# Patient Record
Sex: Male | Born: 1985 | Race: Asian | Hispanic: Yes | Marital: Married | Smoking: Current every day smoker
Health system: Southern US, Community
[De-identification: ages and names within clinical notes are randomized; demographics above are authoritative.]

---

## 2017-01-19 ENCOUNTER — Ambulatory Visit (HOSPITAL_COMMUNITY)
Admission: EM | Admit: 2017-01-19 | Discharge: 2017-01-19 | Disposition: A | Discharge status: Home / Self Care | Payer: Self-pay | Attending: Family Medicine | Admitting: Family Medicine

## 2017-01-19 ENCOUNTER — Ambulatory Visit (INDEPENDENT_AMBULATORY_CARE_PROVIDER_SITE_OTHER): Payer: Self-pay

## 2017-01-19 ENCOUNTER — Encounter (HOSPITAL_COMMUNITY): Payer: Self-pay | Admitting: *Deleted

## 2017-01-19 DIAGNOSIS — L03116 Cellulitis of left lower limb: Secondary | ICD-10-CM

## 2017-01-19 MED ORDER — TRAMADOL HCL 50 MG PO TABS: 50.0000 mg | ORAL_TABLET | Freq: Four times a day (QID) | ORAL | 0 refills | Status: AC | PRN

## 2017-01-19 MED ORDER — AMOXICILLIN-POT CLAVULANATE 875-125 MG PO TABS: 1.0000 | ORAL_TABLET | Freq: Two times a day (BID) | ORAL | 0 refills | Status: AC

## 2017-01-19 NOTE — ED Provider Notes (Signed)
[facility]    CSN: [id number] Arrival date & time: 01/19/17  1119     History   Chief Complaint Chief Complaint  Patient presents with  . Leg Swelling    HPI Aaron Lester is a 31 y.o. male.   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 He thinks he may have a foreign body injury but is not very communicative.      History reviewed. No pertinent past medical history.  There are no active problems to display for Aaron patient.   History reviewed. No pertinent surgical history.     Home Medications    Prior to Admission medications   Medication Sig Start Date End Date Taking? Authorizing Provider  amoxicillin-clavulanate (AUGMENTIN) 875-125 MG tablet Take 1 tablet by mouth every 12 (twelve) hours. 01/19/17   Elvina Sidle, MD  traMADol (ULTRAM) 50 MG tablet Take 1 tablet (50 mg total) by mouth every 6 (six) hours as needed. 01/19/17   Elvina Sidle, MD    Family History No family history on file.  Social History Social History  Substance Use Topics  . Smoking status: Current Every Day Smoker  . Smokeless tobacco: Not on file  . Alcohol use No     Allergies   Patient has no known allergies.   Review of Systems Review of Systems  Musculoskeletal: Positive for gait problem and joint swelling.  Skin: Positive for rash and wound.  All other systems reviewed and are negative.    Physical Exam Triage Vital Signs ED Triage Vitals [01/19/17 1202]  Enc Vitals Group     BP 108/72     Pulse Rate 100     Resp 18     Temp 98.2 F (36.8 C)     Temp Source Oral     SpO2 100 %     Weight      Height      Head Circumference      Peak Flow      Pain Score 10     Pain Loc    Pain Edu?      Excl. in GC?    No data found.   Updated Vital Signs BP 108/72 (BP Location: Right Arm)   Pulse 100   Temp 98.2 F (36.8 C) (Oral)   Resp 18   SpO2 100%    Physical Exam  Constitutional: He is oriented to person, place, and time. He appears well-developed and well-nourished. He appears distressed.  HENT:  Right Ear: External ear normal.  Left Ear: External ear normal.  Eyes: Conjunctivae and EOM are normal. Pupils are equal, round, and reactive to light.  Neck: Normal range of motion. Neck supple.  Pulmonary/Chest: Effort normal.  Musculoskeletal:  Patient has erythema in the left knee popliteal region with a dry scab medially. There is some swelling and erythema extending in all directions from that eschar about 3-4 cm.  Neurological: He is alert and oriented to person, place, and time.  Skin: Skin is warm and  dry. There is erythema.  Nursing note and vitals reviewed.    UC Treatments / Results  Labs (all labs ordered are listed, but only abnormal results are displayed) Labs Reviewed - No data to display  EKG  EKG Interpretation None       Radiology Dg Knee Complete 4 Views Left  Result Date: 01/19/2017 CLINICAL DATA:  Evaluate for foreign body in left knee. Pain and swelling. EXAM: LEFT KNEE - COMPLETE 4+ VIEW COMPARISON:  None. FINDINGS: No evidence of fracture, dislocation, or joint effusion. Sharpening the tibial spines and medial compartment narrowing identified compatible with degenerative joint disease. Soft tissues are unremarkable. IMPRESSION: 1. No radiopaque foreign bodies identified.  The 2. Degenerative joint disease. Electronically Signed   By: Signa Kellaylor  Stroud M.D.   On: 01/19/2017 12:50    Procedures Procedures (including critical care time)  Medications Ordered in UC Medications - No data to display   Initial Impression / Assessment and Plan / UC Course  I have reviewed the triage vital signs and the nursing notes.  Pertinent  labs & imaging results that were available during my care of the patient were reviewed by me and considered in my medical decision making (see chart for details).     Final Clinical Impressions(s) / UC Diagnoses   Final diagnoses:  Cellulitis of left lower extremity    New Prescriptions New Prescriptions   AMOXICILLIN-CLAVULANATE (AUGMENTIN) 875-125 MG TABLET    Take 1 tablet by mouth every 12 (twelve) hours.   TRAMADOL (ULTRAM) 50 MG TABLET    Take 1 tablet (50 mg total) by mouth every 6 (six) hours as needed.     Elvina SidleLauenstein, Ryszard Socarras, MD 01/19/17 1306

## 2017-01-19 NOTE — ED Triage Notes (Signed)
Pt   Reports     He   Has   Swelling     Behind    l  Knee    denys    Any   specefic  Injury          Puncture   Wound     Present

## 2018-09-22 IMAGING — DX DG KNEE COMPLETE 4+V*L*
4 series · 4 of 4 positions shown · non-contrast
Comparison: None.

CLINICAL DATA: Evaluate for foreign body in left knee. Pain and
swelling.

EXAM:
LEFT KNEE - COMPLETE 4+ VIEW

[knee ap]
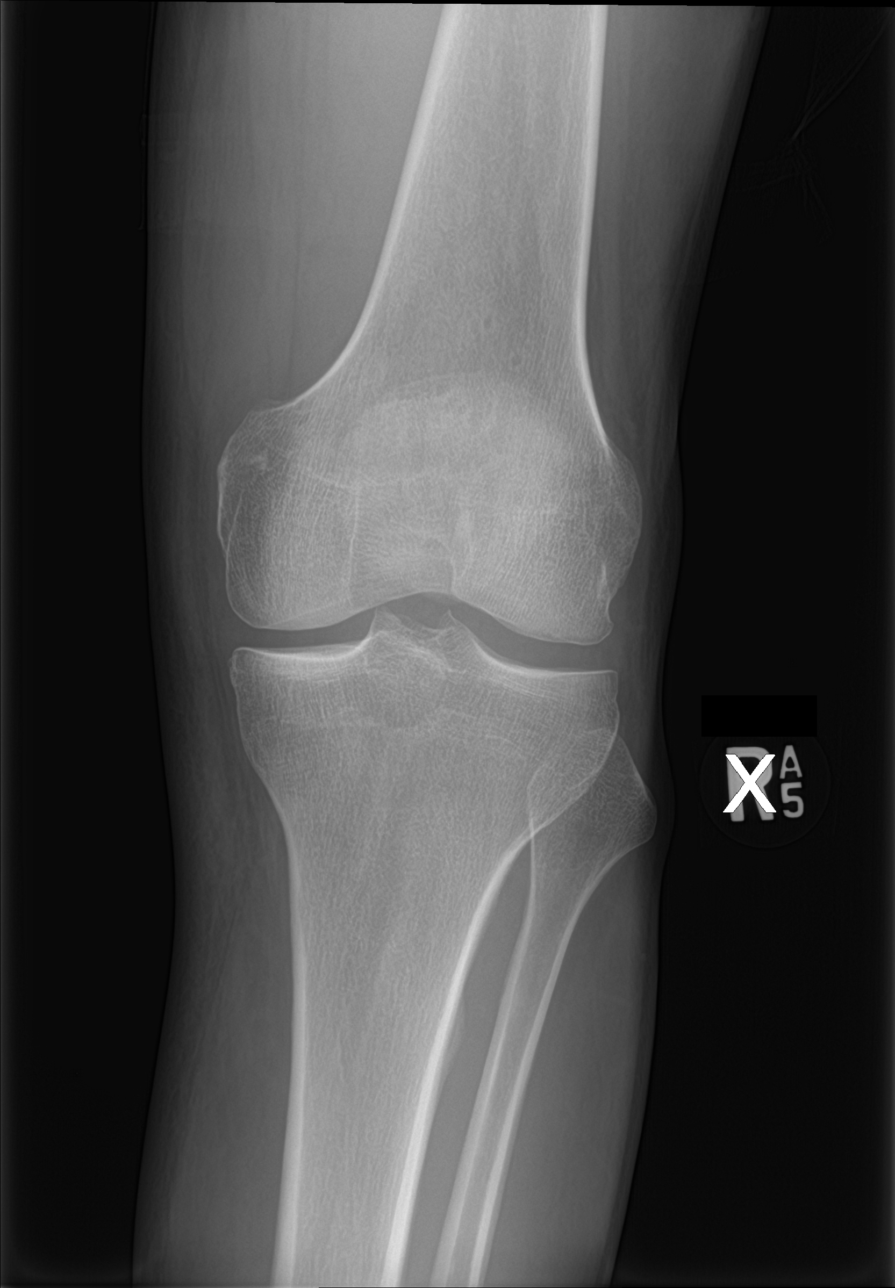

[knee obl (1 of 2)]
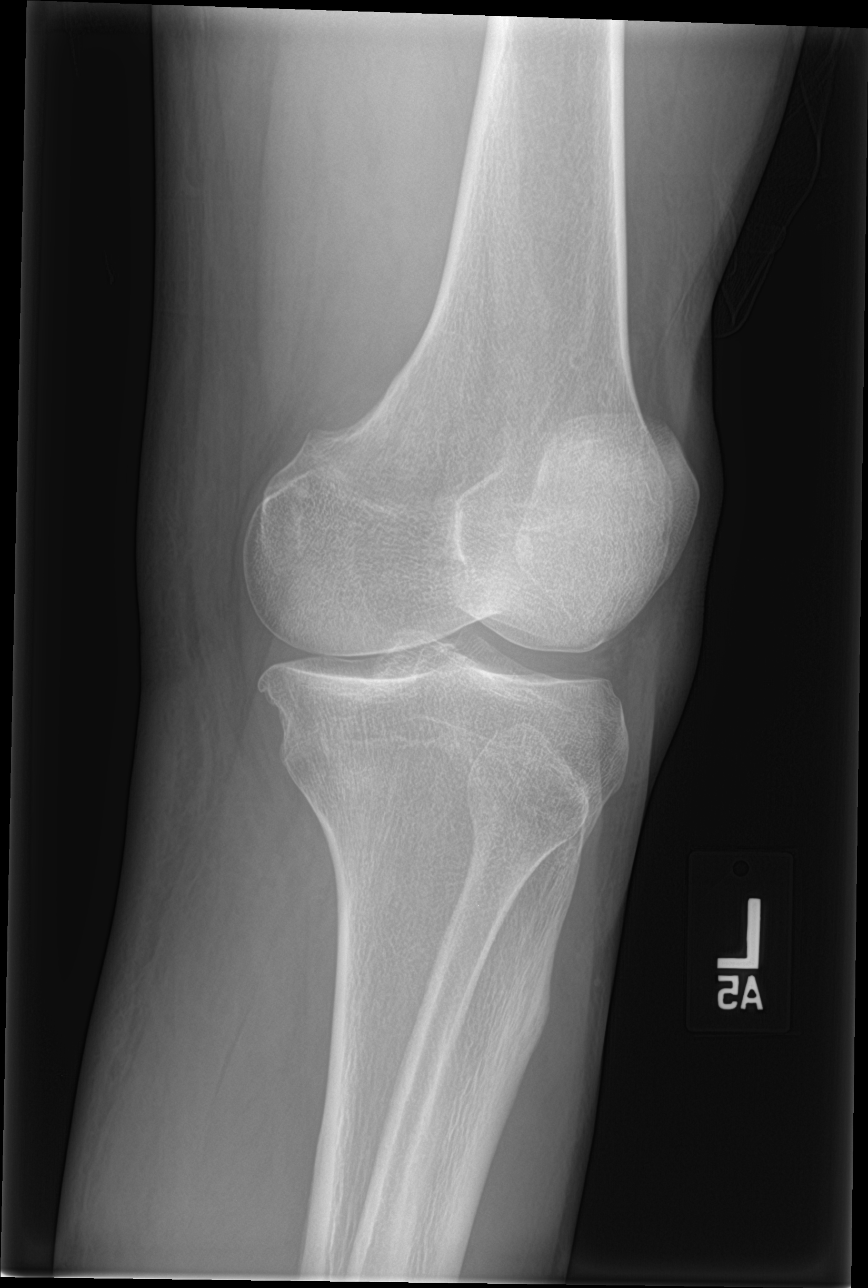

[knee obl (2 of 2)]
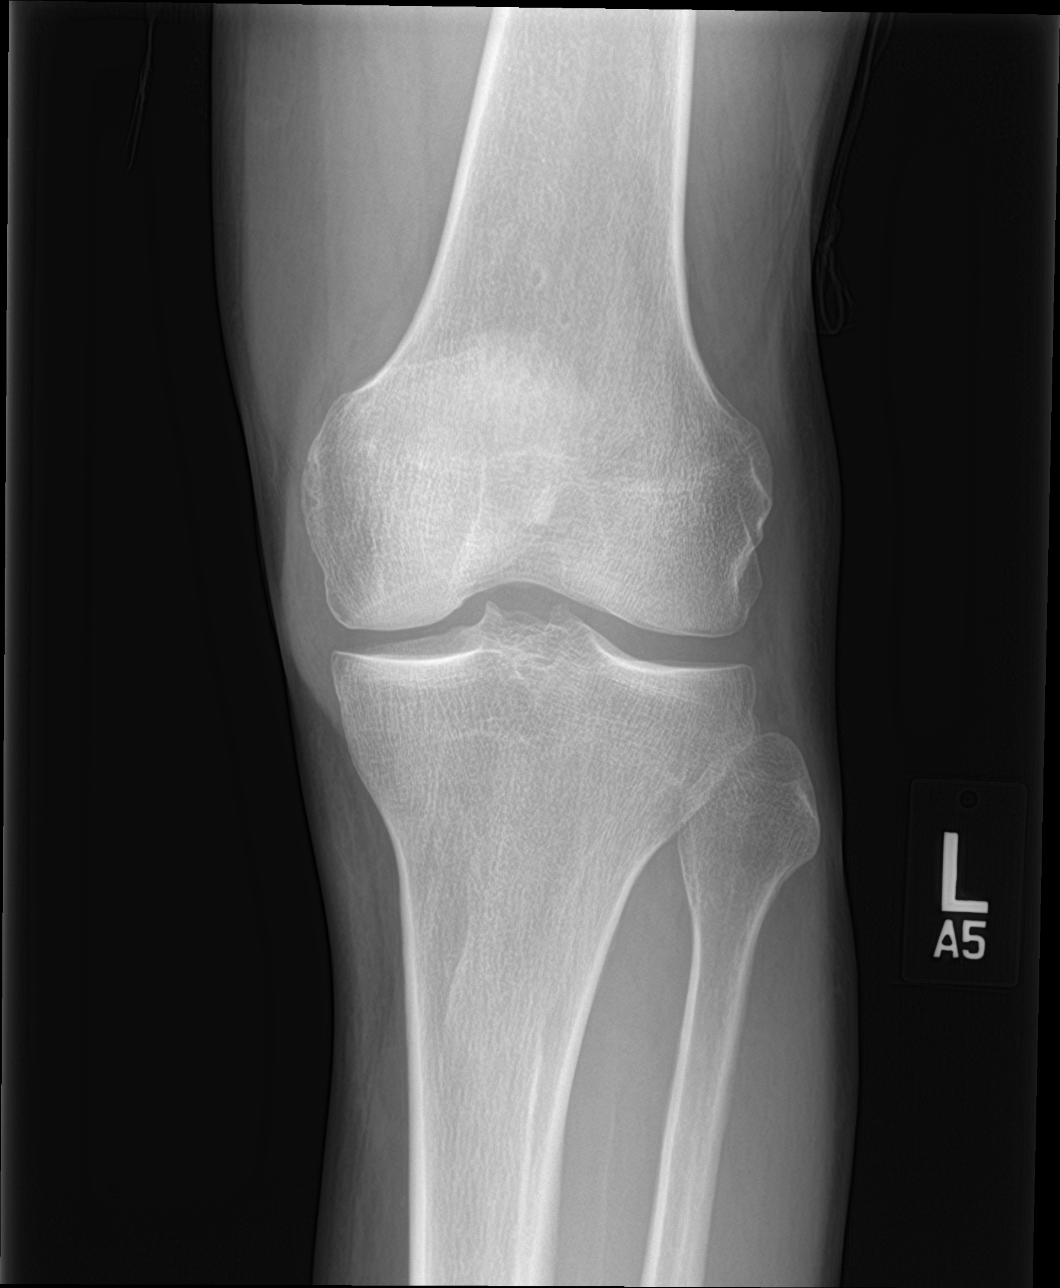

[knee lat]
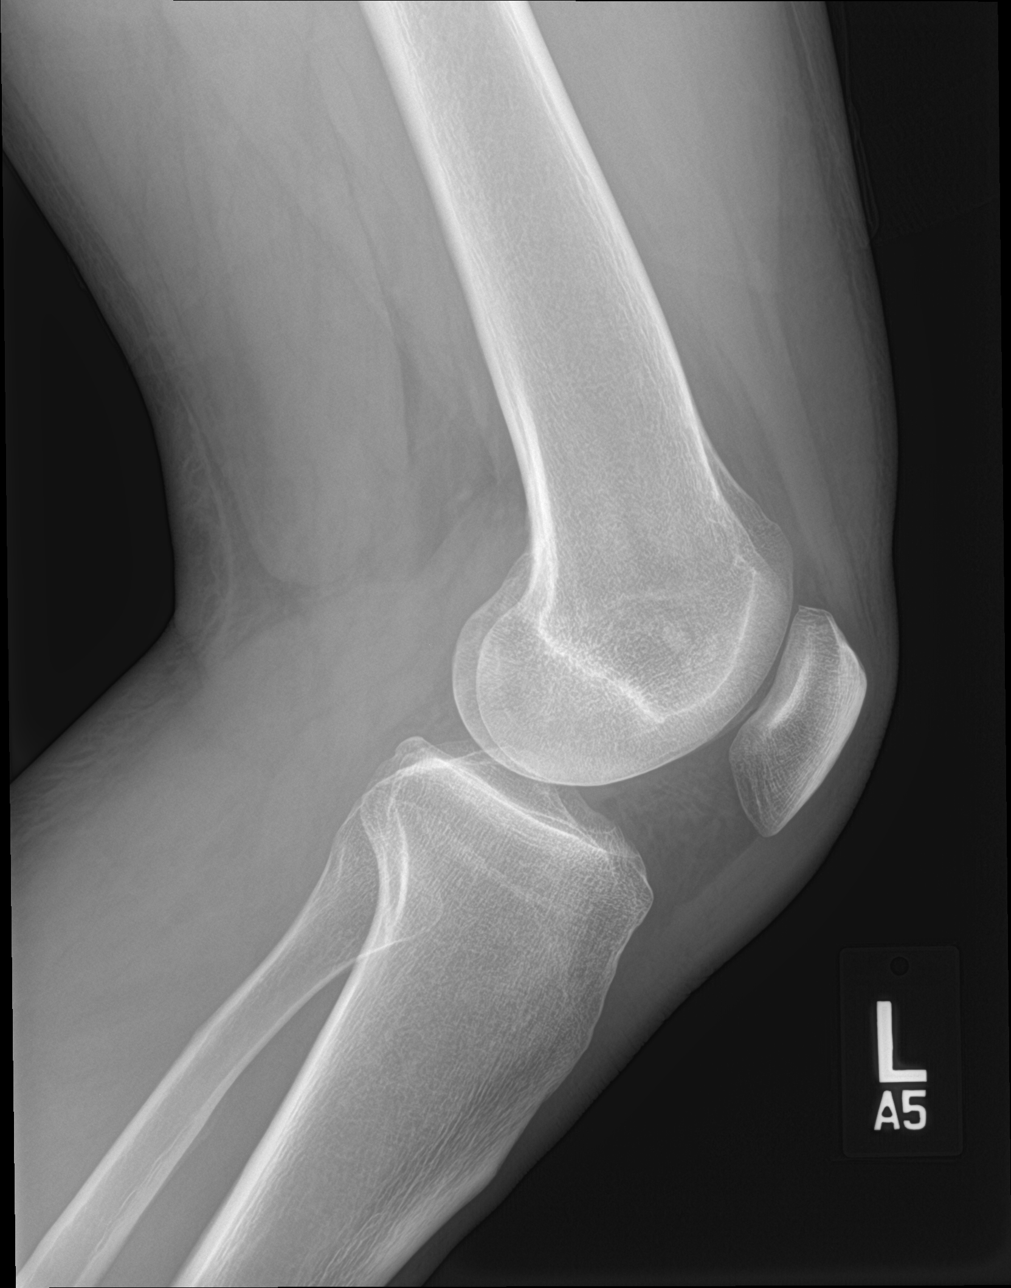

[4 of 4 positions shown; findings below may reference images not displayed]

FINDINGS: No evidence of fracture, dislocation, or joint effusion. Sharpening
the tibial spines and medial compartment narrowing identified
compatible with degenerative joint disease. Soft tissues are
unremarkable.
IMPRESSION: 1. No radiopaque foreign bodies identified.  The
2. Degenerative joint disease.
